# Patient Record
Sex: Female | Born: 1970 | Race: Black or African American | Hispanic: No | Marital: Married | Smoking: Never smoker
Health system: Southern US, Community
[De-identification: ages and names within clinical notes are randomized; demographics above are authoritative.]

---

## 2013-11-02 ENCOUNTER — Ambulatory Visit
Admission: RE | Admit: 2013-11-02 | Discharge: 2013-11-02 | Disposition: A | Discharge status: Home / Self Care | Payer: No Typology Code available for payment source | Source: Ambulatory Visit | Attending: Infectious Disease | Admitting: Infectious Disease

## 2013-11-02 ENCOUNTER — Other Ambulatory Visit: Payer: Self-pay | Admitting: Infectious Disease

## 2013-11-02 DIAGNOSIS — A150 Tuberculosis of lung: Secondary | ICD-10-CM

## 2013-11-16 ENCOUNTER — Ambulatory Visit: Payer: Self-pay | Admitting: Internal Medicine

## 2013-11-20 ENCOUNTER — Encounter: Payer: Self-pay | Admitting: Internal Medicine

## 2013-11-20 ENCOUNTER — Ambulatory Visit: Payer: Medicaid Other | Attending: Internal Medicine | Admitting: Internal Medicine

## 2013-11-20 VITALS — BP 121/82 | HR 76 | Temp 98.8°F | Resp 16 | Wt 128.0 lb

## 2013-11-20 DIAGNOSIS — Z139 Encounter for screening, unspecified: Secondary | ICD-10-CM

## 2013-11-20 DIAGNOSIS — M25562 Pain in left knee: Secondary | ICD-10-CM

## 2013-11-20 DIAGNOSIS — Z008 Encounter for other general examination: Secondary | ICD-10-CM | POA: Insufficient documentation

## 2013-11-20 DIAGNOSIS — M25561 Pain in right knee: Secondary | ICD-10-CM

## 2013-11-20 DIAGNOSIS — M25569 Pain in unspecified knee: Secondary | ICD-10-CM

## 2013-11-20 DIAGNOSIS — N912 Amenorrhea, unspecified: Secondary | ICD-10-CM

## 2013-11-20 MED ORDER — IBUPROFEN 600 MG PO TABS: 600.0000 mg | ORAL_TABLET | Freq: Three times a day (TID) | ORAL | Status: DC | PRN

## 2013-11-20 NOTE — Progress Notes (Signed)
Patient Demographics  Loistine ChanceFrewoini Seifert, is a 43 y.o. female  ZOX:[id number]  [id number]:[id number]  DOB - 06/06/1971  CC:  Chief Complaint  Patient presents with  . Establish Care       HPI: Loistine ChanceFrewoini Fornwalt is a 43 y.o. female here today to establish medical care. Patient reported to have not had menstrual periods for the last 8 years, as per patient she [place] problem but her as per patient he could not diagnose problem, as per patient she has children, denies any abdominal pain nausea vomiting any change in bowel habits, patient does not take any medications, she reported to have bilateral knee pain, she denies any fall or trauma. Patient denies any surgeries in the past. Patient has No headache, No chest pain, No abdominal pain - No Nausea, No new weakness tingling or numbness, No Cough - SOB.  No Known Allergies History reviewed. No pertinent past medical history. No current outpatient prescriptions on file prior to visit.   No current facility-administered medications on file prior to visit.   History reviewed. No pertinent family history. History   Social History  . Marital Status: Married    Spouse Name: N/A    Number of Children: N/A  . Years of Education: N/A   Occupational History  . Not on file.   Social History Main Topics  . Smoking status: Never Smoker   . Smokeless tobacco: Not on file  . Alcohol Use: No  . Drug Use: Not on file  . Sexual Activity: Not on file   Other Topics Concern  . Not on file   Social History Narrative  . No narrative on file    Review of Systems: Constitutional: Negative for fever, chills, diaphoresis, activity change, appetite change and fatigue. HENT: Negative for ear pain, nosebleeds, congestion, facial swelling, rhinorrhea, neck pain, neck stiffness and ear discharge.  Eyes: Negative for pain, discharge, redness, itching and visual disturbance. Respiratory: Negative for cough, choking,  chest tightness, shortness of breath, wheezing and stridor.  Cardiovascular: Negative for chest pain, palpitations and leg swelling. Gastrointestinal: Negative for abdominal distention. Genitourinary: Negative for dysuria, urgency, frequency, hematuria, flank pain, decreased urine volume, difficulty urinating and dyspareunia.  Musculoskeletal: Negative for back pain, joint swelling, +arthralgia and gait problem. Neurological: Negative for dizziness, tremors, seizures, syncope, facial asymmetry, speech difficulty, weakness, light-headedness, numbness and headaches.  Hematological: Negative for adenopathy. Does not bruise/bleed easily. Psychiatric/Behavioral: Negative for hallucinations, behavioral problems, confusion, dysphoric mood, decreased concentration and agitation.    Objective:   Filed Vitals:   11/20/13 1406  BP: 121/82  Pulse: 76  Temp: 98.8 F (37.1 C)  Resp: 16    Physical Exam: Constitutional: Patient appears well-developed and well-nourished. No distress. HENT: Normocephalic, atraumatic, External right and left ear normal. Oropharynx is clear and moist..Hyperpigmented and depigmented skin patches on the face Eyes: Conjunctivae and EOM are normal. PERRLA, no scleral icterus. Neck: Normal ROM. Neck supple. No JVD. No tracheal deviation. No thyromegaly. CVS: RRR, S1/S2 +, no murmurs, no gallops, no carotid bruit.  Pulmonary: Effort and breath sounds normal, no stridor, rhonchi, wheezes, rales.  Abdominal: Soft. BS +, no distension, tenderness, rebound or guarding.  Musculoskeletal: Normal range of motion. Bilateral knees crepitation positive localized soft tissue swelling no tenderness.  Neuro: Alert. Normal reflexes, muscle tone coordination. No cranial nerve deficit. Skin: Skin is warm and dry. No rash noted. Not diaphoretic. No erythema. No pallor. Psychiatric: Normal mood and affect.  Behavior, judgment, thought content normal.  No results found for this basename: WBC,  HGB, HCT, MCV, PLT   No results found for this basename: CREATININE, BUN, NA, K, CL, CO2    No results found for this basename: HGBA1C   Lipid Panel  No results found for this basename: chol, trig, hdl, cholhdl, vldl, ldlcalc       Assessment and plan:   1. Amenorrhea  - TSH; Future - Ambulatory referral to Gynecology - hCG, serum, qualitative; and prolactin level  2. Screening Baseline fasting blood work.  - CBC with Differential; Future - COMPLETE METABOLIC PANEL WITH GFR; Future - Lipid panel; Future - MM DIGITAL SCREENING BILATERAL; Future - TSH; Future - Vit D  25 hydroxy (rtn osteoporosis monitoring); Future  3. Bilateral knee pain  - DG Knee Bilateral Standing AP; Future - ibuprofen (ADVIL,MOTRIN) 600 MG tablet; Take 1 tablet (600 mg total) by mouth every 8 (eight) hours as needed.  Dispense: 30 tablet; Refill: 1        Health Maintenance  -Pap Smear: referred to GYN -Mammogram: ordered    Return in about 3 months (around 02/19/2014), or if symptoms worsen or fail to improve.   Doris Cheadleeepak Cathern Tahir, MD

## 2013-11-20 NOTE — Progress Notes (Signed)
Patient here with interpreter Here to establish care Takes no prescribed medications

## 2013-11-23 ENCOUNTER — Ambulatory Visit (HOSPITAL_COMMUNITY)
Admission: RE | Admit: 2013-11-23 | Discharge: 2013-11-23 | Disposition: A | Discharge status: Home / Self Care | Payer: Medicaid Other | Source: Ambulatory Visit | Attending: Internal Medicine | Admitting: Internal Medicine

## 2013-11-23 ENCOUNTER — Ambulatory Visit: Payer: Medicaid Other | Attending: Internal Medicine

## 2013-11-23 DIAGNOSIS — N912 Amenorrhea, unspecified: Secondary | ICD-10-CM

## 2013-11-23 DIAGNOSIS — M25561 Pain in right knee: Secondary | ICD-10-CM

## 2013-11-23 DIAGNOSIS — M25562 Pain in left knee: Secondary | ICD-10-CM

## 2013-11-23 DIAGNOSIS — M25569 Pain in unspecified knee: Secondary | ICD-10-CM | POA: Insufficient documentation

## 2013-11-23 DIAGNOSIS — Z139 Encounter for screening, unspecified: Secondary | ICD-10-CM

## 2013-11-23 LAB — LIPID PANEL
Cholesterol: 182 mg/dL (ref 0–200)
HDL: 54 mg/dL (ref >39)
LDL Cholesterol: 119 mg/dL — ABNORMAL HIGH (ref 0–99)
Total CHOL/HDL Ratio: 3.4 Ratio
Triglycerides: 47 mg/dL (ref <150)
VLDL: 9 mg/dL (ref 0–40)

## 2013-11-23 LAB — COMPLETE METABOLIC PANEL WITH GFR
ALT: 13 U/L (ref 0–35)
AST: 15 U/L (ref 0–37)
Albumin: 4.3 g/dL (ref 3.5–5.2)
Alkaline Phosphatase: 64 U/L (ref 39–117)
BUN: 11 mg/dL (ref 6–23)
CO2: 27 mEq/L (ref 19–32)
Calcium: 10 mg/dL (ref 8.4–10.5)
Chloride: 105 mEq/L (ref 96–112)
Creat: 0.62 mg/dL (ref 0.50–1.10)
GFR, Est African American: >89 mL/min
GFR, Est Non African American: >89 mL/min
Glucose, Bld: 102 mg/dL — ABNORMAL HIGH (ref 70–99)
Potassium: 4.1 mEq/L (ref 3.5–5.3)
Sodium: 140 mEq/L (ref 135–145)
Total Bilirubin: 0.8 mg/dL (ref 0.2–1.2)
Total Protein: 7.3 g/dL (ref 6.0–8.3)

## 2013-11-23 LAB — CBC WITH DIFFERENTIAL/PLATELET
BASOS ABS: 0 10*3/uL (ref 0.0–0.1)
BASOS PCT: 0 % (ref 0–1)
Eosinophils Absolute: 0.1 10*3/uL (ref 0.0–0.7)
Eosinophils Relative: 1 % (ref 0–5)
HCT: 37.6 % (ref 36.0–46.0)
Hemoglobin: 12.8 g/dL (ref 12.0–15.0)
Lymphocytes Relative: 25 % (ref 12–46)
Lymphs Abs: 2.4 10*3/uL (ref 0.7–4.0)
MCH: 27.7 pg (ref 26.0–34.0)
MCHC: 34 g/dL (ref 30.0–36.0)
MCV: 81.4 fL (ref 78.0–100.0)
MONO ABS: 0.7 10*3/uL (ref 0.1–1.0)
MONOS PCT: 7 % (ref 3–12)
NEUTROS ABS: 6.4 10*3/uL (ref 1.7–7.7)
NEUTROS PCT: 67 % (ref 43–77)
Platelets: 243 10*3/uL (ref 150–400)
RBC: 4.62 MIL/uL (ref 3.87–5.11)
RDW: 13.7 % (ref 11.5–15.5)
WBC: 9.6 10*3/uL (ref 4.0–10.5)

## 2013-11-23 LAB — TSH: TSH: 3.989 u[IU]/mL (ref 0.350–4.500)

## 2013-11-24 LAB — PROLACTIN: Prolactin: 9.2 ng/mL

## 2013-11-24 LAB — HCG, SERUM, QUALITATIVE: Preg, Serum: NEGATIVE

## 2013-11-24 LAB — VITAMIN D 25 HYDROXY (VIT D DEFICIENCY, FRACTURES): Vit D, 25-Hydroxy: 12 ng/mL — ABNORMAL LOW (ref 30–89)

## 2013-11-26 ENCOUNTER — Telehealth: Payer: Self-pay

## 2013-11-26 MED ORDER — VITAMIN D (ERGOCALCIFEROL) 1.25 MG (50000 UNIT) PO CAPS: 50000.0000 [IU] | ORAL_CAPSULE | ORAL | Status: DC

## 2013-11-26 NOTE — Telephone Encounter (Signed)
Message copied by Lestine MountJUAREZ, Shea Swalley L on Mon Nov 26, 2013  9:55 AM ------      Message from: Doris CheadleADVANI, DEEPAK      Created: Mon Nov 26, 2013  9:12 AM       Blood work reviewed, noticed low vitamin D, call patient advise to start ergocalciferol 50,000 units once a week for the duration of  12 weeks.      noticed impaired fasting glucose, call and advise patient for low carbohydrate diet.       ------

## 2013-11-26 NOTE — Telephone Encounter (Signed)
Interpreter line used  Patient is aware of her results Prescription sent to [facility]

## 2013-11-27 ENCOUNTER — Encounter: Payer: Self-pay | Admitting: Obstetrics & Gynecology

## 2013-12-21 ENCOUNTER — Encounter: Payer: Self-pay | Admitting: Obstetrics & Gynecology

## 2013-12-21 ENCOUNTER — Ambulatory Visit (INDEPENDENT_AMBULATORY_CARE_PROVIDER_SITE_OTHER): Payer: Medicaid Other | Admitting: Obstetrics & Gynecology

## 2013-12-21 ENCOUNTER — Ambulatory Visit (HOSPITAL_COMMUNITY)
Admission: RE | Admit: 2013-12-21 | Discharge: 2013-12-21 | Disposition: A | Discharge status: Home / Self Care | Payer: Medicaid Other | Source: Ambulatory Visit | Attending: Obstetrics & Gynecology | Admitting: Obstetrics & Gynecology

## 2013-12-21 VITALS — BP 118/80 | HR 70 | Temp 97.4°F | Ht 62.0 in | Wt 133.3 lb

## 2013-12-21 DIAGNOSIS — N912 Amenorrhea, unspecified: Secondary | ICD-10-CM

## 2013-12-21 DIAGNOSIS — Z01419 Encounter for gynecological examination (general) (routine) without abnormal findings: Secondary | ICD-10-CM

## 2013-12-21 DIAGNOSIS — Z0000 Encounter for general adult medical examination without abnormal findings: Secondary | ICD-10-CM

## 2013-12-21 DIAGNOSIS — Z1231 Encounter for screening mammogram for malignant neoplasm of breast: Secondary | ICD-10-CM | POA: Insufficient documentation

## 2013-12-21 NOTE — Progress Notes (Signed)
Used pacific interpreter [phone]

## 2013-12-21 NOTE — Patient Instructions (Signed)
Secondary Amenorrhea   Secondary amenorrhea is the stopping of menstrual flow for 3 6 months in a female who has previously had periods. There are many possible causes. Most of these causes are not serious. Usually, treating the underlying problem causing the loss of menses will return your periods to normal.  CAUSES   Some common and uncommon causes of not menstruating include:   Malnutrition.   Low blood sugar (hypoglycemia).   Polycystic ovary disease.   Stress or fear.   Breastfeeding.   Hormone imbalance.   Ovarian failure.   Medicines.   Extreme obesity.   Cystic fibrosis.   Low body weight or drastic weight reduction from any cause.   Early menopause.   Removal of ovaries or uterus.   Contraceptives.   Illness.   Long-term (chronic) illnesses.   Cushing syndrome.   Thyroid problems.   Birth control pills, patches, or vaginal rings for birth control.  RISK FACTORS  You may be at greater risk of secondary amenorrhea if:   You have a family history of this condition.   You have an eating disorder.   You do athletic training.  DIAGNOSIS   A diagnosis is made by your health care provider taking a medical history and doing a physical exam. This will include a pelvic exam to check for problems with your reproductive organs. Pregnancy must be ruled out. Often, numerous blood tests are done to measure different hormones in the body. Urine testing may be done. Specialized exams (ultrasound, CT scan, MRI, or hysteroscopy) may have to be done as well as measuring the body mass index (BMI).  TREATMENT   Treatment depends on the cause of the amenorrhea. If an eating disorder is present, this can be treated with an adequate diet and therapy. Chronic illnesses may improve with treatment of the illness. Amenorrhea may be corrected with medicines, lifestyle changes, or surgery. If the amenorrhea cannot be corrected, it is sometimes possible to create a false menstruation with medicines.  HOME CARE  INSTRUCTIONS   Maintain a healthy diet.   Manage weight problems.   Exercise regularly but not excessively.   Get adequate sleep.   Manage stress.   Be aware of changes in your menstrual cycle. Keep a record of when your periods occur. Note the date your period starts, how long it lasts, and any problems.  SEEK MEDICAL CARE IF:  Your symptoms do not get better with treatment.  Document Released: 09/06/2006 Document Revised: 03/28/2013 Document Reviewed: 01/11/2013  Exit[employer] Patient Information 2014 [employer].

## 2013-12-21 NOTE — Progress Notes (Signed)
Patient ID: Loistine ChanceFrewoini Culbreth, female   DOB: 1971/03/13, 43 y.o.   MRN: [id number] Subjective:     Loistine ChanceFrewoini Swim is a 43 y.o. female here for a routine exam.  [id number] Current complaints: amenorrhea.  She is not sure if it is related to age.  Mother went through menopause in her 1640's.  No menses since 2007. Jun 28, 2013   Gynecologic History No LMP recorded. Patient is not currently having periods (Reason: Other). Contraception: abstinence Last Pap: thinks that she had recently at the [facility] Last mammogram: never had.   Obstetric History OB History  Gravida Para Term Preterm AB SAB TAB Ectopic Multiple Living  3 2 2  0 1 1 0 0 0 2    # Outcome Date GA Lbr Len/2nd Weight Sex Delivery Anes PTL Lv  3 SAB           2 TRM           1 TRM                The following portions of the patient's history were reviewed and updated as appropriate: allergies, current medications, past family history, past medical history, past social history, past surgical history and problem list.  Review of Systems A comprehensive review of systems was negative.    Objective:    BP 118/80  Pulse 70  Temp(Src) 97.4 F (36.3 C) (Oral)  Ht 5\' 2"  (1.575 m)  Wt 133 lb 4.8 oz (60.464 kg)  BMI 24.37 kg/m2  General Appearance:    Alert, cooperative, no distress, appears stated age              Throat:   Lips, mucosa, and tongue normal; teeth and gums normal  Neck:   Supple, symmetrical, trachea midline, no adenopathy;    thyroid:  no enlargement/tenderness/nodules; no carotid   bruit or JVD  Back:     Symmetric, no curvature, ROM normal, no CVA tenderness  Lungs:     Clear to auscultation bilaterally, respirations unlabored  Chest Wall:    No tenderness or deformity   Heart:    Regular rate and rhythm, S1 and S2 normal, no murmur, rub   or gallop  Breast Exam:    No tenderness, masses, or nipple abnormality  Abdomen:     Soft, non-tender, bowel sounds active all four quadrants,    no masses, no organomegaly   Genitalia:    Normal female without lesion, discharge or tenderness  Rectal:    Normal tone, normal prostate, no masses or tenderness;   guaiac negative stool  Extremities:   Extremities normal, atraumatic, no cyanosis or edema  Pulses:   2+ and symmetric all extremities  Skin:   Skin color, texture, turgor normal, no rashes or lesions            Assessment:    Healthy female exam.    Plan:    f/u [facility]   Mammogram- screening test for breast cancer

## 2013-12-22 LAB — FOLLICLE STIMULATING HORMONE: FSH: 159.2 m[IU]/mL — AB

## 2013-12-25 ENCOUNTER — Telehealth: Payer: Self-pay | Admitting: *Deleted

## 2013-12-25 ENCOUNTER — Encounter: Payer: Self-pay | Admitting: *Deleted

## 2013-12-25 NOTE — Telephone Encounter (Signed)
Called patient using language line tigrinian interpreter.  Interpreter informed me that patient picked up the phone but that they couldn't connect with me. He tried again but connection was dropped. Will send her a letter.

## 2013-12-25 NOTE — Telephone Encounter (Signed)
Message copied by Mannie StabileASH, Jazlynne Milliner A on Tue Dec 25, 2013 10:13 AM ------      Message from: Willodean RosenthalHARRAWAY-SMITH, CAROLYN      Created: Mon Dec 24, 2013  4:36 PM       Please call pt.  Her labs indicate that she is menopausal.  No further work up needed            Thx      clh-S ------

## 2014-01-01 ENCOUNTER — Encounter: Payer: Self-pay | Admitting: *Deleted

## 2014-02-01 ENCOUNTER — Emergency Department (HOSPITAL_COMMUNITY): Payer: No Typology Code available for payment source

## 2014-02-01 ENCOUNTER — Emergency Department (HOSPITAL_COMMUNITY)
Admission: EM | Admit: 2014-02-01 | Discharge: 2014-02-02 | Disposition: A | Discharge status: Home / Self Care | Payer: No Typology Code available for payment source | Attending: Emergency Medicine | Admitting: Emergency Medicine

## 2014-02-01 ENCOUNTER — Emergency Department (INDEPENDENT_AMBULATORY_CARE_PROVIDER_SITE_OTHER)
Admission: EM | Admit: 2014-02-01 | Discharge: 2014-02-01 | Disposition: A | Discharge status: Other Acute Inpatient Hospital | Payer: Medicaid Other | Source: Home / Self Care | Attending: Family Medicine | Admitting: Family Medicine

## 2014-02-01 ENCOUNTER — Encounter (HOSPITAL_COMMUNITY): Payer: Self-pay | Admitting: Emergency Medicine

## 2014-02-01 DIAGNOSIS — Y9389 Activity, other specified: Secondary | ICD-10-CM | POA: Diagnosis not present

## 2014-02-01 DIAGNOSIS — S8000XA Contusion of unspecified knee, initial encounter: Secondary | ICD-10-CM

## 2014-02-01 DIAGNOSIS — S0003XA Contusion of scalp, initial encounter: Secondary | ICD-10-CM | POA: Insufficient documentation

## 2014-02-01 DIAGNOSIS — S1093XA Contusion of unspecified part of neck, initial encounter: Secondary | ICD-10-CM

## 2014-02-01 DIAGNOSIS — Y92410 Unspecified street and highway as the place of occurrence of the external cause: Secondary | ICD-10-CM | POA: Insufficient documentation

## 2014-02-01 DIAGNOSIS — S0083XA Contusion of other part of head, initial encounter: Secondary | ICD-10-CM | POA: Insufficient documentation

## 2014-02-01 DIAGNOSIS — Z3202 Encounter for pregnancy test, result negative: Secondary | ICD-10-CM | POA: Insufficient documentation

## 2014-02-01 DIAGNOSIS — S8002XA Contusion of left knee, initial encounter: Secondary | ICD-10-CM

## 2014-02-01 DIAGNOSIS — S99919A Unspecified injury of unspecified ankle, initial encounter: Secondary | ICD-10-CM | POA: Diagnosis present

## 2014-02-01 DIAGNOSIS — S8990XA Unspecified injury of unspecified lower leg, initial encounter: Secondary | ICD-10-CM | POA: Diagnosis present

## 2014-02-01 DIAGNOSIS — S0093XA Contusion of unspecified part of head, initial encounter: Secondary | ICD-10-CM

## 2014-02-01 LAB — POC URINE PREG, ED: PREG TEST UR: NEGATIVE

## 2014-02-01 MED ORDER — ACETAMINOPHEN 325 MG PO TABS
650.0000 mg | ORAL_TABLET | Freq: Once | ORAL | Status: AC
Start: 2014-02-01 — End: 2014-02-01
  Administered 2014-02-01: 650 mg via ORAL

## 2014-02-01 MED ORDER — ACETAMINOPHEN 325 MG PO TABS
ORAL_TABLET | ORAL | Status: AC
  Filled 2014-02-01: qty 2
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
## 2014-02-01 NOTE — ED Notes (Signed)
Ice pack to forehead for swelling and discomfort
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
## 2014-02-02 NOTE — ED Provider Notes (Signed)
Medical screening examination/treatment/procedure(s) were performed by non-physician practitioner and as supervising physician I was immediately available for consultation/collaboration.  Flint MelterElliott L Andrina Locken, MD 02/02/14 1538

## 2014-02-05 NOTE — ED Provider Notes (Signed)
Medical screening examination/treatment/procedure(s) were performed by a resident physician or non-physician practitioner and as the supervising physician I was immediately available for consultation/collaboration.  Evan Corey, MD    Evan S Corey, MD 02/05/14 0729 

## 2014-02-18 ENCOUNTER — Ambulatory Visit: Payer: Medicaid Other | Attending: Internal Medicine | Admitting: Internal Medicine

## 2014-02-18 ENCOUNTER — Encounter: Payer: Self-pay | Admitting: Internal Medicine

## 2014-02-18 VITALS — BP 103/70 | HR 76 | Temp 98.3°F | Resp 16 | Wt 135.0 lb

## 2014-02-18 DIAGNOSIS — Z139 Encounter for screening, unspecified: Secondary | ICD-10-CM

## 2014-02-18 DIAGNOSIS — M25561 Pain in right knee: Secondary | ICD-10-CM

## 2014-02-18 DIAGNOSIS — M25569 Pain in unspecified knee: Secondary | ICD-10-CM | POA: Diagnosis not present

## 2014-02-18 DIAGNOSIS — M25562 Pain in left knee: Secondary | ICD-10-CM

## 2014-02-18 DIAGNOSIS — R7301 Impaired fasting glucose: Secondary | ICD-10-CM | POA: Diagnosis not present

## 2014-02-18 DIAGNOSIS — E559 Vitamin D deficiency, unspecified: Secondary | ICD-10-CM | POA: Diagnosis not present

## 2014-02-18 MED ORDER — VITAMIN D (ERGOCALCIFEROL) 1.25 MG (50000 UNIT) PO CAPS: 50000.0000 [IU] | ORAL_CAPSULE | ORAL | Status: AC

## 2014-02-18 MED ORDER — IBUPROFEN 600 MG PO TABS: 600.0000 mg | ORAL_TABLET | Freq: Three times a day (TID) | ORAL | Status: AC | PRN

## 2014-02-18 NOTE — Progress Notes (Signed)
MRN: [id number] Name: Norma Palmer  Sex: female Age: 43 y.o. DOB: [birth year]  Allergies: Review of patient's allergies indicates no known allergies.  Chief Complaint  Patient presents with  . Follow-up    HPI: Patient is 43 y.o. female who comes today for followup, she had a blood work done which was reviewed with the patient noticed impaired fasting glucose as well as vitamin D deficiency, she denies any family history of diabetes, she also has history of bilateral knee pain, had an x-ray done which was negative, patient also had a mammogram done.  History reviewed. No pertinent past medical history.  History reviewed. No pertinent past surgical history.    Medication List       This list is accurate as of: 02/18/14  9:47 AM.  Always use your most recent med list.               ibuprofen 600 MG tablet  Commonly known as:  ADVIL,MOTRIN  Take 1 tablet (600 mg total) by mouth every 8 (eight) hours as needed.     Vitamin D (Ergocalciferol) 50000 UNITS Caps capsule  Commonly known as:  DRISDOL  Take 1 capsule (50,000 Units total) by mouth every 7 (seven) days.        Meds ordered this encounter  Medications  . Vitamin D, Ergocalciferol, (DRISDOL) 50000 UNITS CAPS capsule    Sig: Take 1 capsule (50,000 Units total) by mouth every 7 (seven) days.    Dispense:  12 capsule    Refill:  0  . ibuprofen (ADVIL,MOTRIN) 600 MG tablet    Sig: Take 1 tablet (600 mg total) by mouth every 8 (eight) hours as needed.    Dispense:  30 tablet    Refill:  1     There is no immunization history on file for this patient.  History reviewed. No pertinent family history.  History  Substance Use Topics  . Smoking status: Never Smoker   . Smokeless tobacco: Not on file  . Alcohol Use: No    Review of Systems   As noted in HPI  Filed Vitals:   02/18/14 0911  BP: 103/70  Pulse: 76  Temp: 98.3 F (36.8 C)  Resp: 16    Physical Exam  Physical Exam  Constitutional:  No distress.  Eyes: EOM are normal. Pupils are equal, round, and reactive to light.  Cardiovascular: Normal rate and regular rhythm.   Pulmonary/Chest: Breath sounds normal. No respiratory distress. She has no wheezes. She has no rales.  Musculoskeletal: She exhibits no edema.    CBC    Component Value Date/Time   WBC 9.6 11/23/2013 0912   RBC 4.62 11/23/2013 0912   HGB 12.8 11/23/2013 0912   HCT 37.6 11/23/2013 0912   PLT 243 11/23/2013 0912   MCV 81.4 11/23/2013 0912   LYMPHSABS 2.4 11/23/2013 0912   MONOABS 0.7 11/23/2013 0912   EOSABS 0.1 11/23/2013 0912   BASOSABS 0.0 11/23/2013 0912    CMP     Component Value Date/Time   NA 140 11/23/2013 0912   K 4.1 11/23/2013 0912   CL 105 11/23/2013 0912   CO2 27 11/23/2013 0912   GLUCOSE 102* 11/23/2013 0912   BUN 11 11/23/2013 0912   CREATININE 0.62 11/23/2013 0912   CALCIUM 10.0 11/23/2013 0912   PROT 7.3 11/23/2013 0912   ALBUMIN 4.3 11/23/2013 0912   AST 15 11/23/2013 0912   ALT 13 11/23/2013 0912   ALKPHOS 64 11/23/2013 0912  BILITOT 0.8 11/23/2013 0912   GFRNONAA >89 11/23/2013 0912   GFRAA >89 11/23/2013 0912    Lab Results  Component Value Date/Time   CHOL 182 11/23/2013  9:12 AM    No components found with this basename: hga1c    Lab Results  Component Value Date/Time   AST 15 11/23/2013  9:12 AM    Assessment and Plan  IFG (impaired fasting glucose) I Have advised patient for low carbohydrate diet  Unspecified vitamin D deficiency - Plan: Started patient on Vitamin D, Ergocalciferol, (DRISDOL) 50000 UNITS CAPS capsule  Bilateral knee pain - Plan: ibuprofen (ADVIL,MOTRIN) 600 MG tablet when necessary for pain   Return in about 3 months (around 05/21/2014) for IFG.  Doris CheadleADVANI, Emmry Hinsch, MD

## 2014-02-18 NOTE — Patient Instructions (Signed)
Diabetes Mellitus and Food It is important for you to manage your blood sugar (glucose) level. Your blood glucose level can be greatly affected by what you eat. Eating healthier foods in the appropriate amounts throughout the day at about the same time each day will help you control your blood glucose level. It can also help slow or prevent worsening of your diabetes mellitus. Healthy eating may even help you improve the level of your blood pressure and reach or maintain a healthy weight.  HOW CAN FOOD AFFECT ME? Carbohydrates Carbohydrates affect your blood glucose level more than any other type of food. Your dietitian will help you determine how many carbohydrates to eat at each meal and teach you how to count carbohydrates. Counting carbohydrates is important to keep your blood glucose at a healthy level, especially if you are using insulin or taking certain medicines for diabetes mellitus. Alcohol Alcohol can cause sudden decreases in blood glucose (hypoglycemia), especially if you use insulin or take certain medicines for diabetes mellitus. Hypoglycemia can be a life-threatening condition. Symptoms of hypoglycemia (sleepiness, dizziness, and disorientation) are similar to symptoms of having too much alcohol.  If your health care provider has given you approval to drink alcohol, do so in moderation and use the following guidelines:  Women should not have more than one drink per day, and men should not have more than two drinks per day. One drink is equal to:  12 oz of beer.  5 oz of wine.  1 oz of hard liquor.  Do not drink on an empty stomach.  Keep yourself hydrated. Have water, diet soda, or unsweetened iced tea.  Regular soda, juice, and other mixers might contain a lot of carbohydrates and should be counted. WHAT FOODS ARE NOT RECOMMENDED? As you make food choices, it is important to remember that all foods are not the same. Some foods have fewer nutrients per serving than other  foods, even though they might have the same number of calories or carbohydrates. It is difficult to get your body what it needs when you eat foods with fewer nutrients. Examples of foods that you should avoid that are high in calories and carbohydrates but low in nutrients include:  Trans fats (most processed foods list trans fats on the Nutrition Facts label).  Regular soda.  Juice.  Candy.  Sweets, such as cake, pie, doughnuts, and cookies.  Fried foods. WHAT FOODS CAN I EAT? Have nutrient-rich foods, which will nourish your body and keep you healthy. The food you should eat also will depend on several factors, including:  The calories you need.  The medicines you take.  Your weight.  Your blood glucose level.  Your blood pressure level.  Your cholesterol level. You also should eat a variety of foods, including:  Protein, such as meat, poultry, fish, tofu, nuts, and seeds (lean animal proteins are best).  Fruits.  Vegetables.  Dairy products, such as milk, cheese, and yogurt (low fat is best).  Breads, grains, pasta, cereal, rice, and beans.  Fats such as olive oil, trans fat-free margarine, canola oil, avocado, and olives. DOES EVERYONE WITH DIABETES MELLITUS HAVE THE SAME MEAL PLAN? Because every person with diabetes mellitus is different, there is not one meal plan that works for everyone. It is very important that you meet with a dietitian who will help you create a meal plan that is just right for you. Document Released: 04/22/2005 Document Revised: 07/31/2013 Document Reviewed: 06/22/2013 [employer] Patient Information 2015 [employer]. This   information is not intended to replace advice given to you by your health care provider. Make sure you discuss any questions you have with your health care provider.  

## 2014-02-18 NOTE — Progress Notes (Signed)
Patient here with interpreter States here for follow up and to get results Of her xray and blood work

## 2014-06-10 ENCOUNTER — Encounter: Payer: Self-pay | Admitting: Internal Medicine
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
